# Patient Record
Sex: Male | Born: 1985 | Race: Black or African American | Hispanic: No | Marital: Married | Smoking: Never smoker
Health system: Southern US, Community
[De-identification: ages and names within clinical notes are randomized; demographics above are authoritative.]

---

## 2018-01-23 ENCOUNTER — Ambulatory Visit
Admission: RE | Admit: 2018-01-23 | Discharge: 2018-01-23 | Disposition: A | Discharge status: Home / Self Care | Payer: No Typology Code available for payment source | Source: Ambulatory Visit | Attending: Nurse Practitioner | Admitting: Nurse Practitioner

## 2018-01-23 ENCOUNTER — Other Ambulatory Visit: Payer: Self-pay | Admitting: Nurse Practitioner

## 2018-01-23 DIAGNOSIS — Z021 Encounter for pre-employment examination: Secondary | ICD-10-CM

## 2019-07-30 ENCOUNTER — Other Ambulatory Visit: Payer: Self-pay

## 2019-07-30 ENCOUNTER — Inpatient Hospital Stay (HOSPITAL_COMMUNITY)
Admission: EM | Admit: 2019-07-30 | Discharge: 2019-08-02 | DRG: 177 | Disposition: A | Discharge status: Home / Self Care | Payer: BC Managed Care – PPO | Attending: Family Medicine | Admitting: Family Medicine

## 2019-07-30 ENCOUNTER — Encounter (HOSPITAL_COMMUNITY): Payer: Self-pay | Admitting: Physician Assistant

## 2019-07-30 ENCOUNTER — Emergency Department (HOSPITAL_COMMUNITY): Payer: BC Managed Care – PPO

## 2019-07-30 DIAGNOSIS — E876 Hypokalemia: Secondary | ICD-10-CM | POA: Diagnosis present

## 2019-07-30 DIAGNOSIS — R0902 Hypoxemia: Secondary | ICD-10-CM

## 2019-07-30 DIAGNOSIS — Z23 Encounter for immunization: Secondary | ICD-10-CM

## 2019-07-30 DIAGNOSIS — J9601 Acute respiratory failure with hypoxia: Secondary | ICD-10-CM | POA: Diagnosis present

## 2019-07-30 DIAGNOSIS — J1282 Pneumonia due to coronavirus disease 2019: Secondary | ICD-10-CM | POA: Diagnosis present

## 2019-07-30 DIAGNOSIS — R739 Hyperglycemia, unspecified: Secondary | ICD-10-CM

## 2019-07-30 DIAGNOSIS — U071 COVID-19: Principal | ICD-10-CM | POA: Diagnosis present

## 2019-07-30 DIAGNOSIS — Z6841 Body Mass Index (BMI) 40.0 and over, adult: Secondary | ICD-10-CM | POA: Diagnosis not present

## 2019-07-30 LAB — LACTIC ACID, PLASMA: Lactic Acid, Venous: 0.8 mmol/L (ref 0.5–1.9)

## 2019-07-30 LAB — CBC WITH DIFFERENTIAL/PLATELET
Abs Immature Granulocytes: 0.01 10*3/uL (ref 0.00–0.07)
Basophils Absolute: 0 10*3/uL (ref 0.0–0.1)
Basophils Relative: 0 %
Eosinophils Absolute: 0 10*3/uL (ref 0.0–0.5)
Eosinophils Relative: 0 %
HCT: 48.9 % (ref 39.0–52.0)
Hemoglobin: 16 g/dL (ref 13.0–17.0)
Immature Granulocytes: 0 %
Lymphocytes Relative: 24 %
Lymphs Abs: 1.2 10*3/uL (ref 0.7–4.0)
MCH: 28.7 pg (ref 26.0–34.0)
MCHC: 32.7 g/dL (ref 30.0–36.0)
MCV: 87.8 fL (ref 80.0–100.0)
Monocytes Absolute: 0.5 10*3/uL (ref 0.1–1.0)
Monocytes Relative: 10 %
Neutro Abs: 3.5 10*3/uL (ref 1.7–7.7)
Neutrophils Relative %: 66 %
Platelets: 242 10*3/uL (ref 150–400)
RBC: 5.57 MIL/uL (ref 4.22–5.81)
RDW: 11.4 % — ABNORMAL LOW (ref 11.5–15.5)
WBC: 5.2 10*3/uL (ref 4.0–10.5)
nRBC: 0 % (ref 0.0–0.2)

## 2019-07-30 LAB — COMPREHENSIVE METABOLIC PANEL
ALT: 25 U/L (ref 0–44)
AST: 27 U/L (ref 15–41)
Albumin: 3.4 g/dL — ABNORMAL LOW (ref 3.5–5.0)
Alkaline Phosphatase: 76 U/L (ref 38–126)
Anion gap: 10 (ref 5–15)
BUN: 10 mg/dL (ref 6–20)
CO2: 26 mmol/L (ref 22–32)
Calcium: 8.5 mg/dL — ABNORMAL LOW (ref 8.9–10.3)
Chloride: 103 mmol/L (ref 98–111)
Creatinine, Ser: 0.98 mg/dL (ref 0.61–1.24)
GFR calc Af Amer: >60 mL/min (ref >60)
GFR calc non Af Amer: >60 mL/min (ref >60)
Glucose, Bld: 122 mg/dL — ABNORMAL HIGH (ref 70–99)
Potassium: 3.9 mmol/L (ref 3.5–5.1)
Sodium: 139 mmol/L (ref 135–145)
Total Bilirubin: 0.5 mg/dL (ref 0.3–1.2)
Total Protein: 6.9 g/dL (ref 6.5–8.1)

## 2019-07-30 LAB — TRIGLYCERIDES: Triglycerides: 91 mg/dL (ref <150)

## 2019-07-30 LAB — C-REACTIVE PROTEIN: CRP: 10.7 mg/dL — ABNORMAL HIGH (ref <1.0)

## 2019-07-30 LAB — POC SARS CORONAVIRUS 2 AG -  ED: SARS Coronavirus 2 Ag: POSITIVE — AB

## 2019-07-30 LAB — LACTATE DEHYDROGENASE: LDH: 229 U/L — ABNORMAL HIGH (ref 98–192)

## 2019-07-30 LAB — D-DIMER, QUANTITATIVE: D-Dimer, Quant: 0.46 ug/mL-FEU (ref 0.00–0.50)

## 2019-07-30 LAB — PROCALCITONIN: Procalcitonin: <0.1 ng/mL

## 2019-07-30 LAB — FIBRINOGEN: Fibrinogen: 596 mg/dL — ABNORMAL HIGH (ref 210–475)

## 2019-07-30 LAB — FERRITIN: Ferritin: 235 ng/mL (ref 24–336)

## 2019-07-30 MED ORDER — SODIUM CHLORIDE 0.9 % IV SOLN
100.0000 mg | Freq: Every day | INTRAVENOUS | Status: DC
  Administered 2019-07-31 – 2019-08-02 (×3): 100 mg via INTRAVENOUS
  Filled 2019-07-30 (×4): qty 20

## 2019-07-30 MED ORDER — ACETAMINOPHEN 500 MG PO TABS
1000.0000 mg | ORAL_TABLET | Freq: Once | ORAL | Status: AC
  Administered 2019-07-30: 20:00:00 1000 mg via ORAL
  Filled 2019-07-30: qty 2

## 2019-07-30 MED ORDER — SODIUM CHLORIDE 0.9 % IV SOLN
200.0000 mg | Freq: Once | INTRAVENOUS | Status: AC
  Administered 2019-07-30: 23:00:00 200 mg via INTRAVENOUS
  Filled 2019-07-30: qty 40

## 2019-07-30 NOTE — ED Notes (Signed)
Informed Dr Jacqulyn Bath of Covid pos

## 2019-07-30 NOTE — ED Triage Notes (Signed)
Pt here for [facility] and CP that worsened last night.  Pt tested positive for Covid last week.  Pt dyspneic in triage.

## 2019-07-30 NOTE — ED Provider Notes (Signed)
[facility] EMERGENCY DEPARTMENT Provider Note   CSN: [id number] Arrival date & time: 07/30/19  1801     History No chief complaint on file.   Blake Watson is a 34 y.o. male with no significant past medical history who presents today for evaluation of shortness of breath. He is on day 8 of Covid symptoms.  He reports that last night his shortness of breath and coughing got significantly worse.  He states that he has not had any Tylenol since this morning. States that his wife is an ICU RN upstairs and listen to his lungs and reported that it sounded like he had fluid on them. He reports nausea and one episode of vomiting this morning.  No diarrhea. He denies any past medical history.  He does not take any medications on a daily basis.    HPI     History reviewed. No pertinent past medical history.  There are no problems to display for this patient.   History reviewed. No pertinent surgical history.     History reviewed. No pertinent family history.  Social History   Tobacco Use  . Smoking status: Never Smoker  . Smokeless tobacco: Never Used  Substance Use Topics  . Alcohol use: Not on file  . Drug use: Not on file    Home Medications Prior to Admission medications   Medication Sig Start Date End Date Taking? Authorizing Provider  acetaminophen (TYLENOL) 500 MG tablet Take 500 mg by mouth every 4 (four) hours as needed for mild pain or fever.    Yes [provider]  ibuprofen (ADVIL) 200 MG tablet Take 600 mg by mouth every 4 (four) hours as needed for fever or mild pain.   Yes [provider]    Allergies    Patient has no known allergies.  Review of Systems   Review of Systems  Constitutional: Positive for chills, diaphoresis and fever.  HENT: Positive for congestion.   Eyes: Negative for visual disturbance.  Respiratory: Positive for cough, chest tightness and shortness of breath.   Cardiovascular: Negative for  palpitations and leg swelling.  Gastrointestinal: Positive for nausea and vomiting. Negative for abdominal pain and diarrhea.  Genitourinary: Negative for dysuria.  Musculoskeletal: Positive for arthralgias and myalgias.  Skin: Negative for color change and rash.  Neurological: Positive for headaches. Negative for dizziness and weakness.  Psychiatric/Behavioral: Negative for confusion.  All other systems reviewed and are negative.   Physical Exam Updated Vital Signs BP 125/75 (BP Location: Right Arm)   Pulse 86   Temp 98.7 F (37.1 C) (Oral)   Resp 17   SpO2 97%   Physical Exam Vitals and nursing note reviewed.  Constitutional:      Appearance: He is well-developed. He is obese. He is ill-appearing and diaphoretic.  HENT:     Head: Normocephalic and atraumatic.     Nose: Nose normal.     Mouth/Throat:     Mouth: Mucous membranes are moist.  Eyes:     Conjunctiva/sclera: Conjunctivae normal.  Cardiovascular:     Rate and Rhythm: Regular rhythm. Tachycardia present.     Heart sounds: No murmur.  Pulmonary:     Effort: Pulmonary effort is normal.     Breath sounds: Normal breath sounds.     Comments: Tachypnea, frequently having significant coughing spells without sputum production.  Abdominal:     General: There is no distension.     Palpations: Abdomen is soft.     Tenderness: There  is no abdominal tenderness.  Genitourinary:    Comments: deferred Musculoskeletal:        General: No deformity.     Cervical back: Normal range of motion and neck supple. No rigidity.     Right lower leg: No edema.     Left lower leg: No edema.  Skin:    General: Skin is warm.     Coloration: Skin is not jaundiced.  Neurological:     General: No focal deficit present.     Mental Status: He is alert.  Psychiatric:        Mood and Affect: Mood normal.        Behavior: Behavior normal.     ED Results / Procedures / Treatments   Labs (all labs ordered are listed, but only  abnormal results are displayed) Labs Reviewed  CBC WITH DIFFERENTIAL/PLATELET - Abnormal; Notable for the following components:      Result Value   RDW 11.4 (*)    All other components within normal limits  COMPREHENSIVE METABOLIC PANEL - Abnormal; Notable for the following components:   Glucose, Bld 122 (*)    Calcium 8.5 (*)    Albumin 3.4 (*)    All other components within normal limits  LACTATE DEHYDROGENASE - Abnormal; Notable for the following components:   LDH 229 (*)    All other components within normal limits  FIBRINOGEN - Abnormal; Notable for the following components:   Fibrinogen 596 (*)    All other components within normal limits  C-REACTIVE PROTEIN - Abnormal; Notable for the following components:   CRP 10.7 (*)    All other components within normal limits  POC SARS CORONAVIRUS 2 AG -  ED - Abnormal; Notable for the following components:   SARS Coronavirus 2 Ag POSITIVE (*)    All other components within normal limits  CULTURE, BLOOD (ROUTINE X 2)  CULTURE, BLOOD (ROUTINE X 2)  LACTIC ACID, PLASMA  D-DIMER, QUANTITATIVE (NOT AT [facility])  FERRITIN  TRIGLYCERIDES  LACTIC ACID, PLASMA  PROCALCITONIN    EKG None  Radiology DG Chest Portable 1 View  Result Date: 07/30/2019 CLINICAL DATA:  COVID-19 pneumonia EXAM: PORTABLE CHEST 1 VIEW COMPARISON:  01/23/2018 FINDINGS: The lung volumes are low. There is scattered pulmonary opacities bilaterally. There is no pneumothorax. No large pleural effusion. The heart size is normal. There is no acute osseous abnormality. IMPRESSION: Scattered pulmonary opacities consistent with the patient's history of COVID-19 pneumonia. Electronically Signed   By: Constance Holster M.D.   On: 07/30/2019 19:16    Procedures Procedures (including critical care time)  Medications Ordered in ED Medications  acetaminophen (TYLENOL) tablet 1,000 mg (1,000 mg Oral Given 07/30/19 1959)    ED Course  I have reviewed the triage vital signs and  the nursing notes.  Pertinent labs & imaging results that were available during my care of the patient were reviewed by me and considered in my medical decision making (see chart for details).  Clinical Course as of Jul 29 2122  Mon Jul 30, 2019  1837 Attempted to see patient, he was not in room.    [EH]  2021 D-Dimer, Quant: 0.46 [EH]  2121 Spoke with hospitalist who will see patient for admission.   [EH]    Clinical Course User Index [EH] Lorin Glass, PA-C   During my initial evaluation of patient his oxygen saturation dropped into the high 80s while coughing and speaking.  He is placed on 2 L nasal cannula  oxygen after which his oxygen saturation improved.  MDM Rules/Calculators/A&P                     Patient presents today for evaluation of worsening shortness of breath.  He states he is on day 8 of Covid symptoms however this is not visible in our system. On initial exam he was noted to drop into the high 80s while speaking and coughing with a good waveform and I placed him on 2 L nasal cannula oxygen.  He is febrile at 1-1.3 and tachycardic into the 120s, tachypneic at 30. His tachypnea improved with oxygen. In anticipation of patient being admitted Covid labs were ordered.  CBC and CMP without significant hematologic or electrolyte derangements.  Blood cultures were obtained.  Lactic acid is not elevated at 0.8.  Chest x-ray shows changes consistent with Covid. D-dimer is not significantly elevated.  Patient was treated with Tylenol while in the emergency room.  He was able to maintain his oxygen on 2 liters nasal cannula. With patient's permission I updated his wife, a ICU RN who also recently tested positive about his condition.  I spoke with hospitalist who will see patient for admission.  Blake Watson was evaluated in Emergency Department on 07/30/2019 for the symptoms described in the history of present illness. He was evaluated in the context of the global  COVID-19 pandemic, which necessitated consideration that the patient might be at risk for infection with the SARS-CoV-2 virus that causes COVID-19. Institutional protocols and algorithms that pertain to the evaluation of patients at risk for COVID-19 are in a state of rapid change based on information released by regulatory bodies including the CDC and federal and state organizations. These policies and algorithms were followed during the patient's care in the ED.  Note: Portions of this report may have been transcribed using voice recognition software. Every effort was made to ensure accuracy; however, inadvertent computerized transcription errors may be present  Final Clinical Impression(s) / ED Diagnoses Final diagnoses:  COVID-19  Hypoxia    Rx / DC Orders ED Discharge Orders    None       Norman Clay 07/30/19 2340    Maia Plan, MD 08/01/19 2059

## 2019-07-30 NOTE — H&P (Signed)
[facility] History and Physical  Cartrell Bentsen ZOX:[id number] DOB: 1986-01-01 DOA: 07/30/2019  Referring physician: Phylliss Bob PCP: Patient, No Pcp Per   Chief Complaint: COVID 19 infection  HPI: Akili Corsetti is a 34 y.o. male with no other PMH who presents with worsening shortness of breath in setting of known COVID-19 infection.  Patient reports he was diagnosed with COVID-19 at a [facility] eight days ago. Starting last night he began to have increasing dyspnea. Has also had body chills, fevers, and cough, but denies loss of sense of taste or smell.   Wife is an ICU RN at [facility] [employer] and had been monitoring his O2 levels at home which had been normal. When symptoms worsened he presented to the ED today. While being examined in ED patient had multiple coughing spells during which his O2 sat would drop into the 80's. He was placed on 2L O2 and recommended for admission.   Review of Systems:  12 point ROS conducted with patient and otherwise negative except as noted in HPI  History reviewed. No pertinent past medical history. History reviewed. No pertinent surgical history. Social History:  reports that he has never smoked. He has never used smokeless tobacco. No history on file for alcohol and drug.  No Known Allergies  History reviewed. No pertinent family history.   Prior to Admission medications   Medication Sig Start Date End Date Taking? Authorizing Provider  acetaminophen (TYLENOL) 500 MG tablet Take 500 mg by mouth every 4 (four) hours as needed for mild pain or fever.    Yes [provider]  ibuprofen (ADVIL) 200 MG tablet Take 600 mg by mouth every 4 (four) hours as needed for fever or mild pain.   Yes [provider]   Physical Exam: Vitals:   07/30/19 1915 07/30/19 1940 07/30/19 1956 07/30/19 2121  BP:  (!) 171/91  125/75  Pulse: (!) 102 (!) 104 98 86  Resp:    17  Temp:    98.7 F (37.1 C)  TempSrc:    Oral  SpO2: 100% 91% 98% 97%    Wt  Readings from Last 3 Encounters:  No data found for Wt    General:  Appears calm and comfortable Eyes: PERRL, normal lids, irises & conjunctiva ENT: grossly normal hearing, lips & tongue Neck: no LAD, masses or thyromegaly Cardiovascular: RRR. No LE edema. Telemetry: SR, no arrhythmias  Respiratory: Normal respiratory effort, mild tachypnea. Abdomen: soft, ntnd Skin: no rash or induration seen on limited exam Musculoskeletal: grossly normal tone BUE/BLE Psychiatric: grossly normal mood and affect, speech fluent and appropriate Neurologic: grossly non-focal.          Labs on Admission:  Results for orders placed or performed during the hospital encounter of 07/30/19 (from the past 24 hour(s))  CBC with Differential   Collection Time: 07/30/19  6:39 PM  Result Value Ref Range   WBC 5.2 4.0 - 10.5 K/uL   RBC 5.57 4.22 - 5.81 MIL/uL   Hemoglobin 16.0 13.0 - 17.0 g/dL   HCT 48.9 39.0 - 52.0 %   MCV 87.8 80.0 - 100.0 fL   MCH 28.7 26.0 - 34.0 pg   MCHC 32.7 30.0 - 36.0 g/dL   RDW 11.4 (L) 11.5 - 15.5 %   Platelets 242 150 - 400 K/uL   nRBC 0.0 0.0 - 0.2 %   Neutrophils Relative % 66 %   Neutro Abs 3.5 1.7 - 7.7 K/uL   Lymphocytes Relative 24 %   Lymphs  Abs 1.2 0.7 - 4.0 K/uL   Monocytes Relative 10 %   Monocytes Absolute 0.5 0.1 - 1.0 K/uL   Eosinophils Relative 0 %   Eosinophils Absolute 0.0 0.0 - 0.5 K/uL   Basophils Relative 0 %   Basophils Absolute 0.0 0.0 - 0.1 K/uL   Immature Granulocytes 0 %   Abs Immature Granulocytes 0.01 0.00 - 0.07 K/uL  Comprehensive metabolic panel   Collection Time: 07/30/19  6:39 PM  Result Value Ref Range   Sodium 139 135 - 145 mmol/L   Potassium 3.9 3.5 - 5.1 mmol/L   Chloride 103 98 - 111 mmol/L   CO2 26 22 - 32 mmol/L   Glucose, Bld 122 (H) 70 - 99 mg/dL   BUN 10 6 - 20 mg/dL   Creatinine, Ser 0.98 0.61 - 1.24 mg/dL   Calcium 8.5 (L) 8.9 - 10.3 mg/dL   Total Protein 6.9 6.5 - 8.1 g/dL   Albumin 3.4 (L) 3.5 - 5.0 g/dL   AST 27 15  - 41 U/L   ALT 25 0 - 44 U/L   Alkaline Phosphatase 76 38 - 126 U/L   Total Bilirubin 0.5 0.3 - 1.2 mg/dL   GFR calc non Af Amer >60 >60 mL/min   GFR calc Af Amer >60 >60 mL/min   Anion gap 10 5 - 15  Lactic acid, plasma   Collection Time: 07/30/19  7:55 PM  Result Value Ref Range   Lactic Acid, Venous 0.8 0.5 - 1.9 mmol/L  D-dimer, quantitative   Collection Time: 07/30/19  7:55 PM  Result Value Ref Range   D-Dimer, Quant 0.46 0.00 - 0.50 ug/mL-FEU  Procalcitonin   Collection Time: 07/30/19  7:55 PM  Result Value Ref Range   Procalcitonin <0.10 ng/mL  Lactate dehydrogenase   Collection Time: 07/30/19  7:55 PM  Result Value Ref Range   LDH 229 (H) 98 - 192 U/L  Ferritin   Collection Time: 07/30/19  7:55 PM  Result Value Ref Range   Ferritin 235 24 - 336 ng/mL  Fibrinogen   Collection Time: 07/30/19  7:55 PM  Result Value Ref Range   Fibrinogen 596 (H) 210 - 475 mg/dL  C-reactive protein   Collection Time: 07/30/19  7:55 PM  Result Value Ref Range   CRP 10.7 (H) <1.0 mg/dL  Triglycerides   Collection Time: 07/30/19  7:55 PM  Result Value Ref Range   Triglycerides 91 <150 mg/dL  POC SARS Coronavirus 2 Ag-ED - Nasal Swab (BD Veritor Kit)   Collection Time: 07/30/19  8:37 PM  Result Value Ref Range   SARS Coronavirus 2 Ag POSITIVE (A) NEGATIVE   Radiological Exams on Admission: DG Chest Portable 1 View  Result Date: 07/30/2019 CLINICAL DATA:  COVID-19 pneumonia EXAM: PORTABLE CHEST 1 VIEW COMPARISON:  01/23/2018 FINDINGS: The lung volumes are low. There is scattered pulmonary opacities bilaterally. There is no pneumothorax. No large pleural effusion. The heart size is normal. There is no acute osseous abnormality. IMPRESSION: Scattered pulmonary opacities consistent with the patient's history of COVID-19 pneumonia. Electronically Signed   By: Constance Holster M.D.   On: 07/30/2019 19:16    EKG: Independently reviewed. NSR, TWI in III likely normal variant, overall  unremarkable  Assessment/Plan Active Problems:   COVID-19  Lon Klippel is a 34 y.o. male with no other PMH who presents with hypoxemic respiratory failure secondary to COVID-19 infection.  #COVID-19 Pneumonia #Hypoxemic respiratory failure Known COVID infection now day 8 with mild hypoxemia requiring  2L O2 and mild tachypnea. Mild elevations in CRP (10.7) and LDH (229) and Fibrinogen (596), D-dimer/ferritin/procal/lactic acid are normal.  - continuous pulse ox - remdesivir per protocol - dexamethasone per protocol - consider tocilizumab if respiratory status worsens - daily labs per protocol  Code Status: Full code reviewed with patient DVT Prophylaxis: Lovenox 40 QHS Family Communication: wife is ICU RN at [employer], updated by ED team Disposition Plan: admit to inpatient, transfer to [place]   Time spent: 40 min  [facility]

## 2019-07-30 NOTE — ED Notes (Signed)
Report given to [facility] RN [facility]. Pt to go to room 9153. Pt updated.

## 2019-07-31 ENCOUNTER — Other Ambulatory Visit: Payer: Self-pay

## 2019-07-31 DIAGNOSIS — J9601 Acute respiratory failure with hypoxia: Secondary | ICD-10-CM

## 2019-07-31 DIAGNOSIS — U071 COVID-19: Principal | ICD-10-CM

## 2019-07-31 LAB — COMPREHENSIVE METABOLIC PANEL
ALT: 21 U/L (ref 0–44)
AST: 23 U/L (ref 15–41)
Albumin: 3.2 g/dL — ABNORMAL LOW (ref 3.5–5.0)
Alkaline Phosphatase: 68 U/L (ref 38–126)
Anion gap: 9 (ref 5–15)
BUN: 12 mg/dL (ref 6–20)
CO2: 26 mmol/L (ref 22–32)
Calcium: 8.3 mg/dL — ABNORMAL LOW (ref 8.9–10.3)
Chloride: 103 mmol/L (ref 98–111)
Creatinine, Ser: 0.8 mg/dL (ref 0.61–1.24)
GFR calc Af Amer: >60 mL/min (ref >60)
GFR calc non Af Amer: >60 mL/min (ref >60)
Glucose, Bld: 128 mg/dL — ABNORMAL HIGH (ref 70–99)
Potassium: 3.3 mmol/L — ABNORMAL LOW (ref 3.5–5.1)
Sodium: 138 mmol/L (ref 135–145)
Total Bilirubin: 0.5 mg/dL (ref 0.3–1.2)
Total Protein: 6.8 g/dL (ref 6.5–8.1)

## 2019-07-31 LAB — PHOSPHORUS: Phosphorus: 3.1 mg/dL (ref 2.5–4.6)

## 2019-07-31 LAB — ABO/RH: ABO/RH(D): O POS

## 2019-07-31 LAB — CBC WITH DIFFERENTIAL/PLATELET
Abs Immature Granulocytes: 0.02 10*3/uL (ref 0.00–0.07)
Basophils Absolute: 0 10*3/uL (ref 0.0–0.1)
Basophils Relative: 0 %
Eosinophils Absolute: 0 10*3/uL (ref 0.0–0.5)
Eosinophils Relative: 0 %
HCT: 44.7 % (ref 39.0–52.0)
Hemoglobin: 14.6 g/dL (ref 13.0–17.0)
Immature Granulocytes: 0 %
Lymphocytes Relative: 31 %
Lymphs Abs: 1.4 10*3/uL (ref 0.7–4.0)
MCH: 28.5 pg (ref 26.0–34.0)
MCHC: 32.7 g/dL (ref 30.0–36.0)
MCV: 87.1 fL (ref 80.0–100.0)
Monocytes Absolute: 0.4 10*3/uL (ref 0.1–1.0)
Monocytes Relative: 8 %
Neutro Abs: 2.8 10*3/uL (ref 1.7–7.7)
Neutrophils Relative %: 61 %
Platelets: 231 10*3/uL (ref 150–400)
RBC: 5.13 MIL/uL (ref 4.22–5.81)
RDW: 11.7 % (ref 11.5–15.5)
WBC: 4.6 10*3/uL (ref 4.0–10.5)
nRBC: 0 % (ref 0.0–0.2)

## 2019-07-31 LAB — HIV ANTIBODY (ROUTINE TESTING W REFLEX): HIV Screen 4th Generation wRfx: NONREACTIVE

## 2019-07-31 LAB — C-REACTIVE PROTEIN: CRP: 10.1 mg/dL — ABNORMAL HIGH (ref <1.0)

## 2019-07-31 LAB — MAGNESIUM: Magnesium: 1.8 mg/dL (ref 1.7–2.4)

## 2019-07-31 LAB — D-DIMER, QUANTITATIVE: D-Dimer, Quant: 0.53 ug/mL-FEU — ABNORMAL HIGH (ref 0.00–0.50)

## 2019-07-31 LAB — FERRITIN: Ferritin: 212 ng/mL (ref 24–336)

## 2019-07-31 MED ORDER — ENOXAPARIN SODIUM 80 MG/0.8ML ~~LOC~~ SOLN
0.5000 mg/kg | SUBCUTANEOUS | Status: DC
  Administered 2019-08-01 – 2019-08-02 (×2): 70 mg via SUBCUTANEOUS
  Filled 2019-07-31 (×2): qty 0.8

## 2019-07-31 MED ORDER — ENOXAPARIN SODIUM 30 MG/0.3ML ~~LOC~~ SOLN
30.0000 mg | Freq: Once | SUBCUTANEOUS | Status: AC
  Administered 2019-07-31: 10:00:00 30 mg via SUBCUTANEOUS
  Filled 2019-07-31: qty 0.3

## 2019-07-31 MED ORDER — ONDANSETRON HCL 4 MG PO TABS: 4.0000 mg | ORAL_TABLET | Freq: Four times a day (QID) | ORAL | Status: DC | PRN

## 2019-07-31 MED ORDER — DEXAMETHASONE 6 MG PO TABS
6.0000 mg | ORAL_TABLET | ORAL | Status: DC
  Administered 2019-07-31 – 2019-08-02 (×3): 6 mg via ORAL
  Filled 2019-07-31 (×3): qty 1

## 2019-07-31 MED ORDER — ENOXAPARIN SODIUM 40 MG/0.4ML ~~LOC~~ SOLN
40.0000 mg | SUBCUTANEOUS | Status: DC
  Administered 2019-07-31: 40 mg via SUBCUTANEOUS
  Filled 2019-07-31: qty 0.4

## 2019-07-31 MED ORDER — SODIUM CHLORIDE 0.9 % IV SOLN: 100.0000 mg | Freq: Every day | INTRAVENOUS | Status: DC

## 2019-07-31 MED ORDER — POLYETHYLENE GLYCOL 3350 17 G PO PACK: 17.0000 g | PACK | Freq: Every day | ORAL | Status: DC | PRN

## 2019-07-31 MED ORDER — SODIUM CHLORIDE 0.9 % IV SOLN: 200.0000 mg | Freq: Once | INTRAVENOUS | Status: DC

## 2019-07-31 MED ORDER — ONDANSETRON HCL 4 MG/2ML IJ SOLN: 4.0000 mg | Freq: Four times a day (QID) | INTRAMUSCULAR | Status: DC | PRN

## 2019-07-31 MED ORDER — ACETAMINOPHEN 325 MG PO TABS: 650.0000 mg | ORAL_TABLET | Freq: Four times a day (QID) | ORAL | Status: DC | PRN

## 2019-07-31 MED ORDER — TOCILIZUMAB 400 MG/20ML IV SOLN
800.0000 mg | Freq: Once | INTRAVENOUS | Status: AC
  Administered 2019-07-31: 800 mg via INTRAVENOUS
  Filled 2019-07-31: qty 40

## 2019-07-31 NOTE — Progress Notes (Addendum)
Progress Note  Blake Watson DHR:[id number] DOB: May 19, 1986 DOA: 07/30/2019  Referring physician: Phylliss Bob PCP: Patient, No Pcp Per   Chief Complaint: COVID 19 infection  HPI: Blake Watson is a 34 y.o. male with no other PMH who presents with worsening shortness of breath in setting of known COVID-19 infection. Patient reports he was [facility] with [facility] eight days ago. Starting last night he began to have increasing dyspnea. Has also had body chills, fevers, and cough, but denies loss of sense of taste or smell. Wife is an ICU RN at [facility] and had been monitoring his O2 levels at home which had been normal. When symptoms worsened he presented to the ED today. While being examined in ED patient had multiple coughing spells during which his O2 sat would drop into the 80's. He was placed on 2L O2 and recommended for admission.   Subjective:  No acute issues or events overnight, patient feels his respiratory status is mildly improving, still markedly dyspneic with even minimal exertion.  Denies chest pain, nausea, vomiting, diarrhea, constipation, headache, fevers, chills.  Assessment/Plan Active Problems:   COVID-19   Hypoxic respiratory failure in the setting of COVID-19 pneumonia  Admitted after 8 days of symptoms - concern for prolonged hospital stay/high risk for decompensation given time frame/symptoms Added actemra 2/16 x1 Remdesivir/Dexamethasone 07/30/19-->2/19 and 2/24 respectively Continue supportive care, incentive spirometry, proning, early ambulation SpO2: 94 % O2 Flow Rate (L/min): 2 L/min  Recent Labs    07/30/19 1955 07/31/19 0340  DDIMER 0.46 0.53*  FERRITIN 235 212  LDH 229*  --   CRP 10.7* 10.1*   Code Status: Full code DVT Prophylaxis: Lovenox 40 QHS Family Communication: wife is ICU RN at [employer], updated by patient Disposition Plan: admit to inpatient, disposition pending clinical improvement   Physical Exam: Vitals:   07/31/19 0106  07/31/19 0206 07/31/19 0539 07/31/19 0700  BP: (!) 143/83 136/83 136/78 120/73  Pulse: 82 87 83 84  Resp: 18 (!) 23 16 (!) 25  Temp: 98 F (36.7 C) 99.5 F (37.5 C) 99.6 F (37.6 C) 98.8 F (37.1 C)  TempSrc:  Oral Oral Axillary  SpO2: 96% 99% 96% 94%  Weight:  (!) 140.6 kg  (!) 138.2 kg  Height:  2' 4.35" (0.72 m)  6' (1.829 m)    Wt Readings from Last 3 Encounters:  07/31/19 (!) 138.2 kg   General:  Pleasantly resting in bed, No acute distress. HEENT:  Normocephalic atraumatic.  Sclerae nonicteric, noninjected.  Extraocular movements intact bilaterally. Neck:  Without mass or deformity.  Trachea is midline. Lungs:  Clear to auscultate bilaterally without rhonchi, wheeze, or rales. Heart:  Regular rate and rhythm.  Without murmurs, rubs, or gallops. Abdomen:  Soft, nontender, nondistended.  Without guarding or rebound. Extremities: Without cyanosis, clubbing, edema, or obvious deformity. Vascular:  Dorsalis pedis and posterior tibial pulses palpable bilaterally. Skin:  Warm and dry, no erythema, no ulcerations.     Labs on Admission:  Results for orders placed or performed during the hospital encounter of 07/30/19 (from the past 24 hour(s))  CBC with Differential   Collection Time: 07/30/19  6:39 PM  Result Value Ref Range   WBC 5.2 4.0 - 10.5 K/uL   RBC 5.57 4.22 - 5.81 MIL/uL   Hemoglobin 16.0 13.0 - 17.0 g/dL   HCT 48.9 39.0 - 52.0 %   MCV 87.8 80.0 - 100.0 fL   MCH 28.7 26.0 - 34.0 pg   MCHC 32.7 30.0 -  36.0 g/dL   RDW 11.4 (L) 11.5 - 15.5 %   Platelets 242 150 - 400 K/uL   nRBC 0.0 0.0 - 0.2 %   Neutrophils Relative % 66 %   Neutro Abs 3.5 1.7 - 7.7 K/uL   Lymphocytes Relative 24 %   Lymphs Abs 1.2 0.7 - 4.0 K/uL   Monocytes Relative 10 %   Monocytes Absolute 0.5 0.1 - 1.0 K/uL   Eosinophils Relative 0 %   Eosinophils Absolute 0.0 0.0 - 0.5 K/uL   Basophils Relative 0 %   Basophils Absolute 0.0 0.0 - 0.1 K/uL   Immature Granulocytes 0 %   Abs Immature  Granulocytes 0.01 0.00 - 0.07 K/uL  Comprehensive metabolic panel   Collection Time: 07/30/19  6:39 PM  Result Value Ref Range   Sodium 139 135 - 145 mmol/L   Potassium 3.9 3.5 - 5.1 mmol/L   Chloride 103 98 - 111 mmol/L   CO2 26 22 - 32 mmol/L   Glucose, Bld 122 (H) 70 - 99 mg/dL   BUN 10 6 - 20 mg/dL   Creatinine, Ser 0.98 0.61 - 1.24 mg/dL   Calcium 8.5 (L) 8.9 - 10.3 mg/dL   Total Protein 6.9 6.5 - 8.1 g/dL   Albumin 3.4 (L) 3.5 - 5.0 g/dL   AST 27 15 - 41 U/L   ALT 25 0 - 44 U/L   Alkaline Phosphatase 76 38 - 126 U/L   Total Bilirubin 0.5 0.3 - 1.2 mg/dL   GFR calc non Af Amer >60 >60 mL/min   GFR calc Af Amer >60 >60 mL/min   Anion gap 10 5 - 15  Blood Culture (routine x 2)   Collection Time: 07/30/19  7:51 PM   Specimen: BLOOD  Result Value Ref Range   Specimen Description BLOOD LEFT ANTECUBITAL    Special Requests      BOTTLES DRAWN AEROBIC AND ANAEROBIC Blood Culture results may not be optimal due to an excessive volume of blood received in culture bottles   Culture      NO GROWTH < 12 HOURS Performed at [facility], [address] [place], [place] [address]    Report Status PENDING   Lactic acid, plasma   Collection Time: 07/30/19  7:55 PM  Result Value Ref Range   Lactic Acid, Venous 0.8 0.5 - 1.9 mmol/L  D-dimer, quantitative   Collection Time: 07/30/19  7:55 PM  Result Value Ref Range   D-Dimer, Quant 0.46 0.00 - 0.50 ug/mL-FEU  Procalcitonin   Collection Time: 07/30/19  7:55 PM  Result Value Ref Range   Procalcitonin <0.10 ng/mL  Lactate dehydrogenase   Collection Time: 07/30/19  7:55 PM  Result Value Ref Range   LDH 229 (H) 98 - 192 U/L  Ferritin   Collection Time: 07/30/19  7:55 PM  Result Value Ref Range   Ferritin 235 24 - 336 ng/mL  Fibrinogen   Collection Time: 07/30/19  7:55 PM  Result Value Ref Range   Fibrinogen 596 (H) 210 - 475 mg/dL  C-reactive protein   Collection Time: 07/30/19  7:55 PM  Result Value Ref Range   CRP 10.7  (H) <1.0 mg/dL  Triglycerides   Collection Time: 07/30/19  7:55 PM  Result Value Ref Range   Triglycerides 91 <150 mg/dL  POC SARS Coronavirus 2 Ag-ED - Nasal Swab (BD Veritor Kit)   Collection Time: 07/30/19  8:37 PM  Result Value Ref Range   SARS Coronavirus 2 Ag  POSITIVE (A) NEGATIVE  Blood Culture (routine x 2)   Collection Time: 07/30/19 11:05 PM   Specimen: BLOOD  Result Value Ref Range   Specimen Description BLOOD RIGHT ANTECUBITAL    Special Requests      BOTTLES DRAWN AEROBIC AND ANAEROBIC Blood Culture adequate volume   Culture      NO GROWTH < 12 HOURS Performed at [facility], [place] [address] [place], [place] [address]    Report Status PENDING   CBC with Differential/Platelet   Collection Time: 07/31/19  3:40 AM  Result Value Ref Range   WBC 4.6 4.0 - 10.5 K/uL   RBC 5.13 4.22 - 5.81 MIL/uL   Hemoglobin 14.6 13.0 - 17.0 g/dL   HCT 44.7 39.0 - 52.0 %   MCV 87.1 80.0 - 100.0 fL   MCH 28.5 26.0 - 34.0 pg   MCHC 32.7 30.0 - 36.0 g/dL   RDW 11.7 11.5 - 15.5 %   Platelets 231 150 - 400 K/uL   nRBC 0.0 0.0 - 0.2 %   Neutrophils Relative % 61 %   Neutro Abs 2.8 1.7 - 7.7 K/uL   Lymphocytes Relative 31 %   Lymphs Abs 1.4 0.7 - 4.0 K/uL   Monocytes Relative 8 %   Monocytes Absolute 0.4 0.1 - 1.0 K/uL   Eosinophils Relative 0 %   Eosinophils Absolute 0.0 0.0 - 0.5 K/uL   Basophils Relative 0 %   Basophils Absolute 0.0 0.0 - 0.1 K/uL   Immature Granulocytes 0 %   Abs Immature Granulocytes 0.02 0.00 - 0.07 K/uL  Comprehensive metabolic panel   Collection Time: 07/31/19  3:40 AM  Result Value Ref Range   Sodium 138 135 - 145 mmol/L   Potassium 3.3 (L) 3.5 - 5.1 mmol/L   Chloride 103 98 - 111 mmol/L   CO2 26 22 - 32 mmol/L   Glucose, Bld 128 (H) 70 - 99 mg/dL   BUN 12 6 - 20 mg/dL   Creatinine, Ser 0.80 0.61 - 1.24 mg/dL   Calcium 8.3 (L) 8.9 - 10.3 mg/dL   Total Protein 6.8 6.5 - 8.1 g/dL   Albumin 3.2 (L) 3.5 - 5.0 g/dL   AST 23 15 - 41 U/L   ALT 21 0  - 44 U/L   Alkaline Phosphatase 68 38 - 126 U/L   Total Bilirubin 0.5 0.3 - 1.2 mg/dL   GFR calc non Af Amer >60 >60 mL/min   GFR calc Af Amer >60 >60 mL/min   Anion gap 9 5 - 15  C-reactive protein   Collection Time: 07/31/19  3:40 AM  Result Value Ref Range   CRP 10.1 (H) <1.0 mg/dL  D-dimer, quantitative (not at [facility])   Collection Time: 07/31/19  3:40 AM  Result Value Ref Range   D-Dimer, Quant 0.53 (H) 0.00 - 0.50 ug/mL-FEU  Ferritin   Collection Time: 07/31/19  3:40 AM  Result Value Ref Range   Ferritin 212 24 - 336 ng/mL  Magnesium   Collection Time: 07/31/19  3:40 AM  Result Value Ref Range   Magnesium 1.8 1.7 - 2.4 mg/dL  Phosphorus   Collection Time: 07/31/19  3:40 AM  Result Value Ref Range   Phosphorus 3.1 2.5 - 4.6 mg/dL  ABO/Rh   Collection Time: 07/31/19  3:40 AM  Result Value Ref Range   ABO/RH(D)      O POS Performed at [facility], [place] [address], [place], [place] [address]  Radiological Exams on Admission: DG Chest Portable 1 View  Result Date: 07/30/2019 CLINICAL DATA:  COVID-19 pneumonia EXAM: PORTABLE CHEST 1 VIEW COMPARISON:  01/23/2018 FINDINGS: The lung volumes are low. There is scattered pulmonary opacities bilaterally. There is no pneumothorax. No large pleural effusion. The heart size is normal. There is no acute osseous abnormality. IMPRESSION: Scattered pulmonary opacities consistent with the patient's history of COVID-19 pneumonia. Electronically Signed   By: Constance Holster M.D.   On: 07/30/2019 19:16    EKG: Independently reviewed. NSR, TWI in III likely normal variant, overall unremarkable  Time spent: 40 min  Little Ishikawa DO [facility]

## 2019-07-31 NOTE — Plan of Care (Addendum)
Patient in bed resting. No s/s of pain or distress. Patient currently on room air sats greater than 92%. All medication given well tolerated. Spoke to wife Blake Watson, with care plan update. Will continue to monitor for remainder of shift.  Problem: Education: Goal: Knowledge of General Education information will improve Description: Including pain rating scale, medication(s)/side effects and non-pharmacologic comfort measures 07/31/2019 1215 by Winnifred Friar, RN Outcome: Progressing 07/31/2019 1215 by Winnifred Friar, RN Outcome: Progressing   Problem: Health Behavior/Discharge Planning: Goal: Ability to manage health-related needs will improve 07/31/2019 1215 by Winnifred Friar, RN Outcome: Progressing 07/31/2019 1215 by Winnifred Friar, RN Outcome: Progressing   Problem: Clinical Measurements: Goal: Ability to maintain clinical measurements within normal limits will improve 07/31/2019 1215 by Winnifred Friar, RN Outcome: Progressing 07/31/2019 1215 by Winnifred Friar, RN Outcome: Progressing Goal: Will remain free from infection 07/31/2019 1215 by Winnifred Friar, RN Outcome: Progressing 07/31/2019 1215 by Winnifred Friar, RN Outcome: Progressing Goal: Diagnostic test results will improve 07/31/2019 1215 by Winnifred Friar, RN Outcome: Progressing 07/31/2019 1215 by Winnifred Friar, RN Outcome: Progressing Goal: Respiratory complications will improve 07/31/2019 1215 by Winnifred Friar, RN Outcome: Progressing 07/31/2019 1215 by Winnifred Friar, RN Outcome: Progressing Goal: Cardiovascular complication will be avoided 07/31/2019 1215 by Winnifred Friar, RN Outcome: Progressing 07/31/2019 1215 by Winnifred Friar, RN Outcome: Progressing   Problem: Activity: Goal: Risk for activity intolerance will decrease 07/31/2019 1215 by Winnifred Friar, RN Outcome: Progressing 07/31/2019 1215 by Winnifred Friar, RN Outcome: Progressing   Problem:  Nutrition: Goal: Adequate nutrition will be maintained 07/31/2019 1215 by Winnifred Friar, RN Outcome: Progressing 07/31/2019 1215 by Winnifred Friar, RN Outcome: Progressing   Problem: Coping: Goal: Level of anxiety will decrease 07/31/2019 1215 by Winnifred Friar, RN Outcome: Progressing 07/31/2019 1215 by Winnifred Friar, RN Outcome: Progressing   Problem: Elimination: Goal: Will not experience complications related to bowel motility 07/31/2019 1215 by Winnifred Friar, RN Outcome: Progressing 07/31/2019 1215 by Winnifred Friar, RN Outcome: Progressing Goal: Will not experience complications related to urinary retention 07/31/2019 1215 by Winnifred Friar, RN Outcome: Progressing 07/31/2019 1215 by Winnifred Friar, RN Outcome: Progressing   Problem: Pain Managment: Goal: General experience of comfort will improve 07/31/2019 1215 by Winnifred Friar, RN Outcome: Progressing 07/31/2019 1215 by Winnifred Friar, RN Outcome: Progressing   Problem: Safety: Goal: Ability to remain free from injury will improve 07/31/2019 1215 by Winnifred Friar, RN Outcome: Progressing 07/31/2019 1215 by Winnifred Friar, RN Outcome: Progressing   Problem: Skin Integrity: Goal: Risk for impaired skin integrity will decrease 07/31/2019 1215 by Winnifred Friar, RN Outcome: Progressing 07/31/2019 1215 by Winnifred Friar, RN Outcome: Progressing   Problem: Education: Goal: Knowledge of risk factors and measures for prevention of condition will improve 07/31/2019 1215 by Winnifred Friar, RN Outcome: Progressing 07/31/2019 1215 by Winnifred Friar, RN Outcome: Progressing   Problem: Coping: Goal: Psychosocial and spiritual needs will be supported 07/31/2019 1215 by Winnifred Friar, RN Outcome: Progressing 07/31/2019 1215 by Winnifred Friar, RN Outcome: Progressing   Problem: Respiratory: Goal: Will maintain a patent airway 07/31/2019 1215 by Winnifred Friar, RN Outcome: Progressing 07/31/2019 1215 by Winnifred Friar, RN Outcome: Progressing Goal: Complications related to the disease process, condition or treatment will be avoided or minimized 07/31/2019 1215 by Winnifred Friar, RN Outcome: Progressing 07/31/2019 1215 by Smitty Knudsen  S, RN Outcome: Progressing

## 2019-07-31 NOTE — ED Notes (Signed)
[facility] called by [employer] operational officer. [facility] unable to give ETA

## 2019-08-01 DIAGNOSIS — J9601 Acute respiratory failure with hypoxia: Secondary | ICD-10-CM

## 2019-08-01 LAB — COMPREHENSIVE METABOLIC PANEL
ALT: 21 U/L (ref 0–44)
AST: 21 U/L (ref 15–41)
Albumin: 3.4 g/dL — ABNORMAL LOW (ref 3.5–5.0)
Alkaline Phosphatase: 60 U/L (ref 38–126)
Anion gap: 11 (ref 5–15)
BUN: 12 mg/dL (ref 6–20)
CO2: 26 mmol/L (ref 22–32)
Calcium: 8.5 mg/dL — ABNORMAL LOW (ref 8.9–10.3)
Chloride: 100 mmol/L (ref 98–111)
Creatinine, Ser: 0.7 mg/dL (ref 0.61–1.24)
GFR calc Af Amer: >60 mL/min (ref >60)
GFR calc non Af Amer: >60 mL/min (ref >60)
Glucose, Bld: 121 mg/dL — ABNORMAL HIGH (ref 70–99)
Potassium: 3.9 mmol/L (ref 3.5–5.1)
Sodium: 137 mmol/L (ref 135–145)
Total Bilirubin: 0.4 mg/dL (ref 0.3–1.2)
Total Protein: 6.9 g/dL (ref 6.5–8.1)

## 2019-08-01 LAB — CBC WITH DIFFERENTIAL/PLATELET
Abs Immature Granulocytes: 0.01 10*3/uL (ref 0.00–0.07)
Basophils Absolute: 0 10*3/uL (ref 0.0–0.1)
Basophils Relative: 1 %
Eosinophils Absolute: 0 10*3/uL (ref 0.0–0.5)
Eosinophils Relative: 0 %
HCT: 44 % (ref 39.0–52.0)
Hemoglobin: 14.5 g/dL (ref 13.0–17.0)
Immature Granulocytes: 0 %
Lymphocytes Relative: 34 %
Lymphs Abs: 1.4 10*3/uL (ref 0.7–4.0)
MCH: 28.2 pg (ref 26.0–34.0)
MCHC: 33 g/dL (ref 30.0–36.0)
MCV: 85.6 fL (ref 80.0–100.0)
Monocytes Absolute: 0.6 10*3/uL (ref 0.1–1.0)
Monocytes Relative: 13 %
Neutro Abs: 2.2 10*3/uL (ref 1.7–7.7)
Neutrophils Relative %: 52 %
Platelets: 284 10*3/uL (ref 150–400)
RBC: 5.14 MIL/uL (ref 4.22–5.81)
RDW: 11.3 % — ABNORMAL LOW (ref 11.5–15.5)
WBC: 4.1 10*3/uL (ref 4.0–10.5)
nRBC: 0 % (ref 0.0–0.2)

## 2019-08-01 LAB — C-REACTIVE PROTEIN: CRP: 6.6 mg/dL — ABNORMAL HIGH (ref <1.0)

## 2019-08-01 LAB — D-DIMER, QUANTITATIVE: D-Dimer, Quant: 0.45 ug/mL-FEU (ref 0.00–0.50)

## 2019-08-01 MED ORDER — INFLUENZA VAC SPLIT QUAD 0.5 ML IM SUSY
0.5000 mL | PREFILLED_SYRINGE | INTRAMUSCULAR | Status: AC
  Administered 2019-08-02: 09:00:00 0.5 mL via INTRAMUSCULAR
  Filled 2019-08-01: qty 0.5

## 2019-08-01 MED ORDER — DIPHENHYDRAMINE HCL 25 MG PO CAPS
25.0000 mg | ORAL_CAPSULE | Freq: Four times a day (QID) | ORAL | Status: DC | PRN
  Administered 2019-08-01: 25 mg via ORAL
  Filled 2019-08-01: qty 1

## 2019-08-01 MED ORDER — BENZONATATE 100 MG PO CAPS
100.0000 mg | ORAL_CAPSULE | Freq: Two times a day (BID) | ORAL | Status: DC
  Administered 2019-08-01 – 2019-08-02 (×3): 100 mg via ORAL
  Filled 2019-08-01 (×3): qty 1

## 2019-08-01 MED ORDER — GUAIFENESIN-DM 100-10 MG/5ML PO SYRP
5.0000 mL | ORAL_SOLUTION | ORAL | Status: DC | PRN
  Administered 2019-08-01: 5 mL via ORAL
  Filled 2019-08-01: qty 10

## 2019-08-01 MED ORDER — HYDROCOD POLST-CPM POLST ER 10-8 MG/5ML PO SUER: 5.0000 mL | Freq: Two times a day (BID) | ORAL | Status: DC | PRN

## 2019-08-01 NOTE — Plan of Care (Signed)
Pt maintaining oxygen saturation on room air, pt able to independently ambulate in room and hallways, pt independtly and frequently uses flutter valve and incentive spirometer; educated pt to use call bell if any symptoms of SOB/dyspnea throughout night

## 2019-08-01 NOTE — Progress Notes (Addendum)
SATURATION QUALIFICATIONS: (This note is used to comply with regulatory documentation for home oxygen)  Patient Saturations on Room Air at Rest = 92%  Patient Saturations on Room Air while Ambulating = 84%  Patient Saturations on 4 Liters of oxygen while Ambulating = 91-92%%  Please briefly explain why patient needs home oxygen: Desats with activity.

## 2019-08-01 NOTE — Plan of Care (Signed)
Pt maintaining oxygen saturations on room air, pt able to safely ambulate independently

## 2019-08-01 NOTE — Progress Notes (Signed)
   08/01/19 1217  Family/Significant Other Communication  Family/Significant Other Update Called;Updated (Wife [place])

## 2019-08-01 NOTE — Progress Notes (Signed)
Tele monitor called and reported 4 beats of ectopy on the monitor, checked on pt, pt resting in bed using phone, pt denies cp/palpitation/dizziness; will continue to monitor

## 2019-08-01 NOTE — Progress Notes (Addendum)
1850: Page to MD. Patient asking for medication for sleep. Will inform NOC shift nurse, waiting on order.  1908: Page to covering MD asking for sleeping medication. NOC nurse aware of page and has phone.

## 2019-08-01 NOTE — Progress Notes (Addendum)
PROGRESS NOTE  Blake Watson  KDT:[id number] DOB: 1985-10-28 DOA: 07/30/2019 PCP: Patient, No Pcp Per  Brief Narrative: Blake Watson is a 34 y.o. male with no medical history who was diagnosed with covid-19 on 2/8 who presented to the ED 2/15 with worsening shortness of breath found to have hypoxia worse with coughing spells in the ED. SARS-CoV-2 antigen confirmed positivity, CRP 10.7, procalcitonin undetectable, d-dimer wnl, and CXR showed scattered bilateral opacities. The patient was given remdesivir, steroids, and admitted to [facility] for covid-19 pneumonia.  Assessment & Plan: Principal Problem:   COVID-19 Active Problems:   Acute respiratory failure with hypoxia (HCC)  Acute hypoxemic respiratory failure due to covid-19 pneumonia: SARS-CoV-2 Ag positive on 2/15, also has reportedly positive test on 2/8.  - Remains exertionally hypoxemic requiring 4L with exertion. Wean as tolerated. DC tmrw (if no longer hypoxemic, as we discussed) vs. Friday after 5th dose.  - Continue remdesivir x5 days 2/15 - 2/19 - Steroids x10 days. CRP remains elevated 10 > 6.  - Vitamin C, zinc - Encourage OOB, IS, FV, and awake proning if able - Tylenol and antitussives prn - Continue airborne, contact precautions for 21 days from positive testing. - Check CBC w/diff, CMP, CRP daily  Morbid obesity: Estimated body mass index is 41.31 kg/m as calculated from the following:   Height as of this encounter: 6' (1.829 m).   Weight as of this encounter: 138.2 kg.   Hypokalemia: Resolved.  - Continue monitoring  Hyperglycemia:  - Check HbA1c in AM  DVT prophylaxis: Lovenox 0.5mg /kg q24h Code Status: Full Family Communication: Wife called by RN.  Disposition Plan: Return home once hypoxemia improves. Possible 24-48 hours.  Consultants:   None  Procedures:   None  Antimicrobials:  Remdesivir   Subjective: Shortness of breath is about stable, not getting up too much, but requires oxygen when  he does. He denies chest pain or other new symptoms.   Objective: Vitals:   08/01/19 0246 08/01/19 0307 08/01/19 0619 08/01/19 0740  BP:  134/77  123/78  Pulse: 74 77 82 78  Resp: 20 19 19  (!) 24  Temp:  98.7 F (37.1 C)  97.8 F (36.6 C)  TempSrc:  Oral  Oral  SpO2: 93% 92% 92% 93%  Weight:      Height:        Intake/Output Summary (Last 24 hours) at 08/01/2019 1001 Last data filed at 08/01/2019 0900 Gross per 24 hour  Intake 1790 ml  Output --  Net 1790 ml   Filed Weights   07/31/19 0206 07/31/19 0700  Weight: (!) 140.6 kg (!) 138.2 kg    Gen: 34 y.o. male in no distress Pulm: Non-labored breathing supplemental oxygen, tachypneic. Diminished.  CV: Regular rate and rhythm. No murmur, rub, or gallop. No JVD, no pedal edema. GI: Abdomen soft, non-tender, non-distended, with normoactive bowel sounds. No organomegaly or masses felt. Ext: Warm, no deformities Skin: No rashes, lesions or ulcers Neuro: Alert and oriented. No focal neurological deficits. Psych: Judgement and insight appear normal. Mood & affect appropriate.   Data Reviewed: I have personally reviewed following labs and imaging studies  CBC: Recent Labs  Lab 07/30/19 1839 07/31/19 0340 08/01/19 0254  WBC 5.2 4.6 4.1  NEUTROABS 3.5 2.8 2.2  HGB 16.0 14.6 14.5  HCT 48.9 44.7 44.0  MCV 87.8 87.1 85.6  PLT 242 231 284   Basic Metabolic Panel: Recent Labs  Lab 07/30/19 1839 07/31/19 0340 08/01/19 0254  NA 139 138 137  K 3.9 3.3* 3.9  CL 103 103 100  CO2 26 26 26   GLUCOSE 122* 128* 121*  BUN 10 12 12   CREATININE 0.98 0.80 0.70  CALCIUM 8.5* 8.3* 8.5*  MG  --  1.8  --   PHOS  --  3.1  --    GFR: Estimated Creatinine Clearance: 189.1 mL/min (by C-G formula based on SCr of 0.7 mg/dL). Liver Function Tests: Recent Labs  Lab 07/30/19 1839 07/31/19 0340 08/01/19 0254  AST 27 23 21   ALT 25 21 21   ALKPHOS 76 68 60  BILITOT 0.5 0.5 0.4  PROT 6.9 6.8 6.9  ALBUMIN 3.4* 3.2* 3.4*   No results  for input(s): LIPASE, AMYLASE in the last 168 hours. No results for input(s): AMMONIA in the last 168 hours. Coagulation Profile: No results for input(s): INR, PROTIME in the last 168 hours. Cardiac Enzymes: No results for input(s): CKTOTAL, CKMB, CKMBINDEX, TROPONINI in the last 168 hours. BNP (last 3 results) No results for input(s): PROBNP in the last 8760 hours. HbA1C: No results for input(s): HGBA1C in the last 72 hours. CBG: No results for input(s): GLUCAP in the last 168 hours. Lipid Profile: Recent Labs    07/30/19 1955  TRIG 91   Thyroid Function Tests: No results for input(s): TSH, T4TOTAL, FREET4, T3FREE, THYROIDAB in the last 72 hours. Anemia Panel: Recent Labs    07/30/19 1955 07/31/19 0340  FERRITIN 235 212   Urine analysis: No results found for: COLORURINE, APPEARANCEUR, LABSPEC, PHURINE, GLUCOSEU, HGBUR, BILIRUBINUR, KETONESUR, PROTEINUR, UROBILINOGEN, NITRITE, LEUKOCYTESUR Recent Results (from the past 240 hour(s))  Blood Culture (routine x 2)     Status: None (Preliminary result)   Collection Time: 07/30/19  7:51 PM   Specimen: BLOOD  Result Value Ref Range Status   Specimen Description BLOOD LEFT ANTECUBITAL  Final   Special Requests   Final    BOTTLES DRAWN AEROBIC AND ANAEROBIC Blood Culture results may not be optimal due to an excessive volume of blood received in culture bottles   Culture   Final    NO GROWTH 2 DAYS Performed at [facility], [place] [address], [place], [place] [address]    Report Status PENDING  Incomplete  Blood Culture (routine x 2)     Status: None (Preliminary result)   Collection Time: 07/30/19 11:05 PM   Specimen: BLOOD  Result Value Ref Range Status   Specimen Description BLOOD RIGHT ANTECUBITAL  Final   Special Requests   Final    BOTTLES DRAWN AEROBIC AND ANAEROBIC Blood Culture adequate volume   Culture   Final    NO GROWTH 2 DAYS Performed at [facility], [place] [address], [place], [place] [address]     Report Status PENDING  Incomplete      Radiology Studies: DG Chest Portable 1 View  Result Date: 07/30/2019 CLINICAL DATA:  COVID-19 pneumonia EXAM: PORTABLE CHEST 1 VIEW COMPARISON:  01/23/2018 FINDINGS: The lung volumes are low. There is scattered pulmonary opacities bilaterally. There is no pneumothorax. No large pleural effusion. The heart size is normal. There is no acute osseous abnormality. IMPRESSION: Scattered pulmonary opacities consistent with the patient's history of COVID-19 pneumonia. Electronically Signed   By: Constance Holster M.D.   On: 07/30/2019 19:16    Scheduled Meds: . benzonatate  100 mg Oral BID  . dexamethasone  6 mg Oral Q24H  . enoxaparin (LOVENOX) injection  0.5 mg/kg Subcutaneous Q24H  . [START ON 08/02/2019] influenza vac split quadrivalent  PF  0.5 mL Intramuscular Tomorrow-1000   Continuous Infusions: . remdesivir 100 mg in NS 100 mL 100 mg (08/01/19 0830)     LOS: 2 days   Time spent: 25 minutes.  Tyrone Nine, MD [facility] [email or web address] 08/01/2019, 10:01 AM

## 2019-08-01 NOTE — Progress Notes (Signed)
SATURATION QUALIFICATIONS: (This note is used to comply with regulatory documentation for home oxygen)  Patient Saturations on Room Air at Rest = 94%  Patient Saturations on Room Air while Ambulating = 88%  Patient Saturations on 0 Liters of oxygen while Ambulating = 87-88%%  Please briefly explain why patient needs home oxygen: Desats with prolonged activity.

## 2019-08-02 LAB — CBC WITH DIFFERENTIAL/PLATELET
Abs Immature Granulocytes: 0.03 10*3/uL (ref 0.00–0.07)
Basophils Absolute: 0 10*3/uL (ref 0.0–0.1)
Basophils Relative: 0 %
Eosinophils Absolute: 0 10*3/uL (ref 0.0–0.5)
Eosinophils Relative: 0 %
HCT: 46.1 % (ref 39.0–52.0)
Hemoglobin: 15.2 g/dL (ref 13.0–17.0)
Immature Granulocytes: 1 %
Lymphocytes Relative: 26 %
Lymphs Abs: 1.7 10*3/uL (ref 0.7–4.0)
MCH: 28.6 pg (ref 26.0–34.0)
MCHC: 33 g/dL (ref 30.0–36.0)
MCV: 86.8 fL (ref 80.0–100.0)
Monocytes Absolute: 0.7 10*3/uL (ref 0.1–1.0)
Monocytes Relative: 11 %
Neutro Abs: 3.9 10*3/uL (ref 1.7–7.7)
Neutrophils Relative %: 62 %
Platelets: 344 10*3/uL (ref 150–400)
RBC: 5.31 MIL/uL (ref 4.22–5.81)
RDW: 11.6 % (ref 11.5–15.5)
WBC: 6.3 10*3/uL (ref 4.0–10.5)
nRBC: 0 % (ref 0.0–0.2)

## 2019-08-02 LAB — COMPREHENSIVE METABOLIC PANEL
ALT: 25 U/L (ref 0–44)
AST: 22 U/L (ref 15–41)
Albumin: 3.2 g/dL — ABNORMAL LOW (ref 3.5–5.0)
Alkaline Phosphatase: 63 U/L (ref 38–126)
Anion gap: 11 (ref 5–15)
BUN: 15 mg/dL (ref 6–20)
CO2: 26 mmol/L (ref 22–32)
Calcium: 8.5 mg/dL — ABNORMAL LOW (ref 8.9–10.3)
Chloride: 100 mmol/L (ref 98–111)
Creatinine, Ser: 0.86 mg/dL (ref 0.61–1.24)
GFR calc Af Amer: >60 mL/min (ref >60)
GFR calc non Af Amer: >60 mL/min (ref >60)
Glucose, Bld: 106 mg/dL — ABNORMAL HIGH (ref 70–99)
Potassium: 3.8 mmol/L (ref 3.5–5.1)
Sodium: 137 mmol/L (ref 135–145)
Total Bilirubin: 0.3 mg/dL (ref 0.3–1.2)
Total Protein: 6.8 g/dL (ref 6.5–8.1)

## 2019-08-02 LAB — C-REACTIVE PROTEIN: CRP: 3 mg/dL — ABNORMAL HIGH (ref <1.0)

## 2019-08-02 LAB — HEMOGLOBIN A1C
Hgb A1c MFr Bld: 5.7 % — ABNORMAL HIGH (ref 4.8–5.6)
Mean Plasma Glucose: 116.89 mg/dL

## 2019-08-02 MED ORDER — DEXAMETHASONE 6 MG PO TABS: 6.0000 mg | ORAL_TABLET | ORAL | 0 refills | Status: AC

## 2019-08-02 NOTE — Progress Notes (Signed)
Patient scheduled for outpatient Remdesivir infusion at 11:30 AM on Friday 2/19.   Please advise them to report to [facility] at [address].  Drive to the security guard and tell them you are here for an infusion. They will direct you to the front entrance where we will come and get you.  For questions call [phone].  Thanks   

## 2019-08-02 NOTE — Progress Notes (Signed)
D/c instructions provided to pt. He verbalizes understanding. Pt family member to pick up pt and take home. There are no other questions. Pt understands he has appt at infusion center tomorrow morning. Pt d/c at this time.

## 2019-08-02 NOTE — Discharge Summary (Signed)
Physician Discharge Summary  Blake Watson YNW:[id number] DOB: [birth year] DOA: 07/30/2019  PCP: Patient, No Pcp Per  Admit date: 07/30/2019 Discharge date: 08/02/2019  Admitted From: Home Disposition: Home   Recommendations for Outpatient Follow-up:  1. Follow up with PCP in 1-2 weeks 2. Please obtain CMP/CBC in one week 3. Please follow up on the following pending results: Hemoglobin A1c  Home Health: None Equipment/Devices: None Discharge Condition: Stable CODE STATUS: Full Diet recommendation: Heart healthy/carb-modified  Brief/Interim Summary: Blake Watson is a 34 y.o. male with no medical history who was diagnosed with covid-19 on 2/8 and presented to the ED 2/15 with worsening shortness of breath found to have hypoxia worse with coughing spells in the ED. SARS-CoV-2 antigen confirmed positivity, CRP 10.7, procalcitonin undetectable, d-dimer wnl, and CXR showed scattered bilateral opacities. The patient was given remdesivir, steroids, and admitted to [facility] for covid-19 pneumonia. Hypoxia has since resolved and he is stable for discharge with appointments to complete remdesivir infusions as an outpatient.  Discharge Diagnoses:  Principal Problem:   COVID-19 Active Problems:   Acute respiratory failure with hypoxia (HCC)  Acute hypoxemic respiratory failure due to covid-19 pneumonia: SARS-CoV-2 Ag positive on 2/15, also has reportedly positive test on 2/8. Has been liberated from supplemental oxygen. - Return to work/may end isolation August 13, 2019.  - Complete 5th dose of remdesivir on 2/19 as scheduled prior to discharge.  - Continue steroids x10 days. CRP remains elevated but improving from 10.7 to 3.0.   Morbid obesity: Estimated body mass index is 41.31 kg/m as calculated from the following:   Height as of this encounter: 6' (1.829 m).   Weight as of this encounter: 138.2 kg.   Hypokalemia: Resolved.   Hyperglycemia: Not severe. Has RF's for diabetes, but no  known diagnosis. - HbA1c checked and pending.   Discharge Instructions Discharge Instructions    Diet - low sodium heart healthy   Complete by: As directed    Diet Carb Modified   Complete by: As directed    Discharge instructions   Complete by: As directed    You are being discharged from the hospital after treatment for covid-19 infection. You are felt to be stable enough to no longer require inpatient monitoring, testing, and treatment, though you will need to follow the recommendations below: - Continue taking decadron as directed for 6 more days starting tomorrow AM - Return here for final remdesivir infusion tomorrow at 11:30am. - Per CDC guidelines, you will need to remain in isolation for 21 days from your first positive covid test. You may return to work August 13, 2019. - Do not take NSAID medications (including, but not limited to, ibuprofen, advil, motrin, naproxen, aleve, goody's powder, etc.) - Follow up with your doctor in the next week via telehealth or seek medical attention right away if your symptoms get WORSE.  - Consider donating plasma after you have recovered (either 14 days after a negative test or 28 days after symptoms have completely resolved) because your antibodies to this virus may be helpful to give to others with life-threatening infections. Please go to the website www.[email or web address] if you would like to consider volunteering for plasma donation.    Directions for you at home:  Wear a facemask You should wear a facemask that covers your nose and mouth when you are in the same room with other people and when you visit a healthcare provider. People who live with or visit you should also wear a facemask while they are  in the same room with you.  Separate yourself from other people in your home As much as possible, you should stay in a different room from other people in your home. Also, you should use a separate bathroom, if available.  Avoid sharing household  items You should not share dishes, drinking glasses, cups, eating utensils, towels, bedding, or other items with other people in your home. After using these items, you should wash them thoroughly with soap and water.  Cover your coughs and sneezes Cover your mouth and nose with a tissue when you cough or sneeze, or you can cough or sneeze into your sleeve. Throw used tissues in a lined trash can, and immediately wash your hands with soap and water for at least 20 seconds or use an alcohol-based hand rub.  Wash your [employer] your hands often and thoroughly with soap and water for at least 20 seconds. You can use an alcohol-based hand sanitizer if soap and water are not available and if your hands are not visibly dirty. Avoid touching your eyes, nose, and mouth with unwashed hands.  Directions for those who live with, or provide care at home for you:  Limit the number of people who have contact with the patient If possible, have only one caregiver for the patient. Other household members should stay in another home or place of residence. If this is not possible, they should stay in another room, or be separated from the patient as much as possible. Use a separate bathroom, if available. Restrict visitors who do not have an essential need to be in the home.  Ensure good ventilation Make sure that shared spaces in the home have good air flow, such as from an air conditioner or an opened window, weather permitting.  Wash your hands often Wash your hands often and thoroughly with soap and water for at least 20 seconds. You can use an alcohol based hand sanitizer if soap and water are not available and if your hands are not visibly dirty. Avoid touching your eyes, nose, and mouth with unwashed hands. Use disposable paper towels to dry your hands. If not available, use dedicated cloth towels and replace them when they become wet.  Wear a facemask and gloves Wear a disposable facemask at  all times in the room and gloves when you touch or have contact with the patient's blood, body fluids, and/or secretions or excretions, such as sweat, saliva, sputum, nasal mucus, vomit, urine, or feces.  Ensure the mask fits over your nose and mouth tightly, and do not touch it during use. Throw out disposable facemasks and gloves after using them. Do not reuse. Wash your hands immediately after removing your facemask and gloves. If your personal clothing becomes contaminated, carefully remove clothing and launder. Wash your hands after handling contaminated clothing. Place all used disposable facemasks, gloves, and other waste in a lined container before disposing them with other household waste. Remove gloves and wash your hands immediately after handling these items.  Do not share dishes, glasses, or other household items with the patient Avoid sharing household items. You should not share dishes, drinking glasses, cups, eating utensils, towels, bedding, or other items with a patient who is confirmed to have, or being evaluated for, COVID-19 infection. After the person uses these items, you should wash them thoroughly with soap and water.  Wash laundry thoroughly Immediately remove and wash clothes or bedding that have blood, body fluids, and/or secretions or excretions, such as sweat, saliva, sputum,  nasal mucus, vomit, urine, or feces, on them. Wear gloves when handling laundry from the patient. Read and follow directions on labels of laundry or clothing items and detergent. In general, wash and dry with the warmest temperatures recommended on the label.  Clean all areas the individual has used often Clean all touchable surfaces, such as counters, tabletops, doorknobs, bathroom fixtures, toilets, phones, keyboards, tablets, and bedside tables, every day. Also, clean any surfaces that may have blood, body fluids, and/or secretions or excretions on them. Wear gloves when cleaning surfaces the  patient has come in contact with. Use a diluted bleach solution (e.g., dilute bleach with 1 part bleach and 10 parts water) or a household disinfectant with a label that says EPA-registered for coronaviruses. To make a bleach solution at home, add 1 tablespoon of bleach to 1 quart (4 cups) of water. For a larger supply, add  cup of bleach to 1 gallon (16 cups) of water. Read labels of cleaning products and follow recommendations provided on product labels. Labels contain instructions for safe and effective use of the cleaning product including precautions you should take when applying the product, such as wearing gloves or eye protection and making sure you have good ventilation during use of the product. Remove gloves and wash hands immediately after cleaning.  Monitor yourself for signs and symptoms of illness Caregivers and household members are considered close contacts, should monitor their health, and will be asked to limit movement outside of the home to the extent possible. Follow the monitoring steps for close contacts listed on the symptom monitoring form.  If you have additional questions, contact your local health department or call the epidemiologist on call at [phone] (available 24/7). This guidance is subject to change. For the most up-to-date guidance from [facility], please refer to their website: [employer]Metro.hu   MyChart COVID-19 home monitoring program   Complete by: Aug 02, 2019    Is the patient willing to use the MyChart Mobile App for home monitoring?: Yes     Allergies as of 08/02/2019   No Known Allergies     Medication List    TAKE these medications   acetaminophen 500 MG tablet Commonly known as: TYLENOL Take 500 mg by mouth every 4 (four) hours as needed for mild pain or fever.   Advil 200 MG tablet Generic drug: ibuprofen Take 600 mg by mouth every 4 (four) hours as needed for fever or mild pain.    dexamethasone 6 MG tablet Commonly known as: DECADRON Take 1 tablet (6 mg total) by mouth daily for 6 days. Start taking on: August 03, 2019      Follow-up Information    [facility]. Go on 08/03/2019.   Why: 11:30am. Drive to the security guard and tell them you are here for an infusion. They will direct you to the front entrance where we will come and get you. For questions call [phone]. Contact information: [address] [place] [address] [phone]         No Known Allergies  Consultations:  None  Procedures/Studies: DG Chest Portable 1 View  Result Date: 07/30/2019 CLINICAL DATA:  COVID-19 pneumonia EXAM: PORTABLE CHEST 1 VIEW COMPARISON:  01/23/2018 FINDINGS: The lung volumes are low. There is scattered pulmonary opacities bilaterally. There is no pneumothorax. No large pleural effusion. The heart size is normal. There is no acute osseous abnormality. IMPRESSION: Scattered pulmonary opacities consistent with the patient's history of COVID-19 pneumonia. Electronically Signed  By: Katherine Mantle M.D.   On: 07/30/2019 19:16   Subjective: Feels better, some shortness of breath with walking around the entire unit. Overall improving, eating well, no chest pain or other new complaints.   Discharge Exam: Vitals:   08/02/19 0435 08/02/19 0800  BP:  127/88  Pulse: 70 79  Resp: 18 20  Temp: 97.9 F (36.6 C) 98 F (36.7 C)  SpO2: 92% 92%   General: Pt is alert, awake, not in acute distress Cardiovascular: RRR, S1/S2 +, no rubs, no gallops Respiratory: Nonlabored on room air, minimal bilateral crackles improved. No wheezes. Abdominal: Soft, NT, ND, bowel sounds + Extremities: No edema, no cyanosis  Labs: BNP (last 3 results) No results for input(s): BNP in the last 8760 hours. Basic Metabolic Panel: Recent Labs  Lab 07/30/19 1839 07/31/19 0340 08/01/19 0254 08/02/19 0233  NA 139 138 137 137  K 3.9 3.3*  3.9 3.8  CL 103 103 100 100  CO2 26 26 26 26   GLUCOSE 122* 128* 121* 106*  BUN 10 12 12 15   CREATININE 0.98 0.80 0.70 0.86  CALCIUM 8.5* 8.3* 8.5* 8.5*  MG  --  1.8  --   --   PHOS  --  3.1  --   --    Liver Function Tests: Recent Labs  Lab 07/30/19 1839 07/31/19 0340 08/01/19 0254 08/02/19 0233  AST 27 23 21 22   ALT 25 21 21 25   ALKPHOS 76 68 60 63  BILITOT 0.5 0.5 0.4 0.3  PROT 6.9 6.8 6.9 6.8  ALBUMIN 3.4* 3.2* 3.4* 3.2*   No results for input(s): LIPASE, AMYLASE in the last 168 hours. No results for input(s): AMMONIA in the last 168 hours. CBC: Recent Labs  Lab 07/30/19 1839 07/31/19 0340 08/01/19 0254 08/02/19 0233  WBC 5.2 4.6 4.1 6.3  NEUTROABS 3.5 2.8 2.2 3.9  HGB 16.0 14.6 14.5 15.2  HCT 48.9 44.7 44.0 46.1  MCV 87.8 87.1 85.6 86.8  PLT 242 231 284 344   Cardiac Enzymes: No results for input(s): CKTOTAL, CKMB, CKMBINDEX, TROPONINI in the last 168 hours. BNP: Invalid input(s): POCBNP CBG: No results for input(s): GLUCAP in the last 168 hours. D-Dimer Recent Labs    07/31/19 0340 08/01/19 0254  DDIMER 0.53* 0.45   Hgb A1c Recent Labs    08/02/19 0233  HGBA1C 5.7*   Lipid Profile Recent Labs    07/30/19 1955  TRIG 91   Thyroid function studies No results for input(s): TSH, T4TOTAL, T3FREE, THYROIDAB in the last 72 hours.  Invalid input(s): FREET3 Anemia work up 08/02/19    07/30/19 1955 07/31/19 0340  FERRITIN 235 212   Urinalysis No results found for: COLORURINE, APPEARANCEUR, LABSPEC, PHURINE, GLUCOSEU, HGBUR, BILIRUBINUR, KETONESUR, PROTEINUR, UROBILINOGEN, NITRITE, LEUKOCYTESUR  Microbiology Recent Results (from the past 240 hour(s))  Blood Culture (routine x 2)     Status: None (Preliminary result)   Collection Time: 07/30/19  7:51 PM   Specimen: BLOOD  Result Value Ref Range Status   Specimen Description BLOOD LEFT ANTECUBITAL  Final   Special Requests   Final    BOTTLES DRAWN AEROBIC AND ANAEROBIC Blood Culture  results may not be optimal due to an excessive volume of blood received in culture bottles   Culture   Final    NO GROWTH 3 DAYS Performed at [facility], [address] [place], 08/01/19 [facility]    Report Status PENDING  Incomplete  Blood Culture (routine x 2)  Status: None (Preliminary result)   Collection Time: 07/30/19 11:05 PM   Specimen: BLOOD  Result Value Ref Range Status   Specimen Description BLOOD RIGHT ANTECUBITAL  Final   Special Requests   Final    BOTTLES DRAWN AEROBIC AND ANAEROBIC Blood Culture adequate volume   Culture   Final    NO GROWTH 3 DAYS Performed at [facility], [address] [place], [place] [address]    Report Status PENDING  Incomplete    Time coordinating discharge: Approximately 40 minutes  Tyrone Nine, MD  [facility] 08/02/2019, 10:44 AM

## 2019-08-02 NOTE — Discharge Instructions (Addendum)
You are scheduled for an outpatient infusion of Remdesivir at 11:30 AM on Friday 2/19.  Please report to Lynnell Catalan at [address].  Drive to the security guard and tell them you are here for an infusion. They will direct you to the front entrance where we will come and get you.  For questions call [phone].  Thanks

## 2019-08-02 NOTE — Plan of Care (Signed)

## 2019-08-03 ENCOUNTER — Encounter (HOSPITAL_COMMUNITY): Payer: Self-pay

## 2019-08-03 ENCOUNTER — Ambulatory Visit (HOSPITAL_COMMUNITY)
Admission: RE | Admit: 2019-08-03 | Discharge: 2019-08-03 | Disposition: A | Discharge status: Home / Self Care | Payer: BC Managed Care – PPO | Source: Ambulatory Visit | Attending: Pulmonary Disease | Admitting: Pulmonary Disease

## 2019-08-03 VITALS — BP 128/90 | HR 74 | Temp 98.5°F | Resp 20

## 2019-08-03 DIAGNOSIS — J9601 Acute respiratory failure with hypoxia: Secondary | ICD-10-CM | POA: Diagnosis present

## 2019-08-03 MED ORDER — EPINEPHRINE 0.3 MG/0.3ML IJ SOAJ: 0.3000 mg | Freq: Once | INTRAMUSCULAR | Status: DC | PRN

## 2019-08-03 MED ORDER — SODIUM CHLORIDE 0.9 % IV SOLN
INTRAVENOUS | Status: DC | PRN
  Administered 2019-08-03: 250 mL via INTRAVENOUS

## 2019-08-03 MED ORDER — METHYLPREDNISOLONE SODIUM SUCC 125 MG IJ SOLR: 125.0000 mg | Freq: Once | INTRAMUSCULAR | Status: DC | PRN

## 2019-08-03 MED ORDER — ALBUTEROL SULFATE HFA 108 (90 BASE) MCG/ACT IN AERS: 2.0000 | INHALATION_SPRAY | Freq: Once | RESPIRATORY_TRACT | Status: DC | PRN

## 2019-08-03 MED ORDER — SODIUM CHLORIDE 0.9 % IV SOLN
100.0000 mg | Freq: Once | INTRAVENOUS | Status: AC
  Administered 2019-08-03: 100 mg via INTRAVENOUS
  Filled 2019-08-03: qty 20

## 2019-08-03 MED ORDER — DIPHENHYDRAMINE HCL 50 MG/ML IJ SOLN: 50.0000 mg | Freq: Once | INTRAMUSCULAR | Status: DC | PRN

## 2019-08-03 MED ORDER — FAMOTIDINE IN NACL 20-0.9 MG/50ML-% IV SOLN: 20.0000 mg | Freq: Once | INTRAVENOUS | Status: DC | PRN

## 2019-08-03 NOTE — Progress Notes (Signed)
  Diagnosis: COVID-19  Physician:Dr Wright  Procedure: Covid Infusion Clinic Med: remdesivir infusion.  Complications: No immediate complications noted.  Discharge: Discharged home   Marvette Schamp 08/03/2019   

## 2019-08-04 LAB — CULTURE, BLOOD (ROUTINE X 2)
Culture: NO GROWTH
Culture: NO GROWTH
Special Requests: ADEQUATE

## 2019-08-28 ENCOUNTER — Other Ambulatory Visit: Payer: Self-pay

## 2020-10-08 IMAGING — DX DG CHEST 1V PORT
1 series · 1 of 1 positions shown · non-contrast
Comparison: 01/23/2018

CLINICAL DATA: 9LIMU-IP pneumonia

EXAM:
PORTABLE CHEST 1 VIEW

[chest ap]
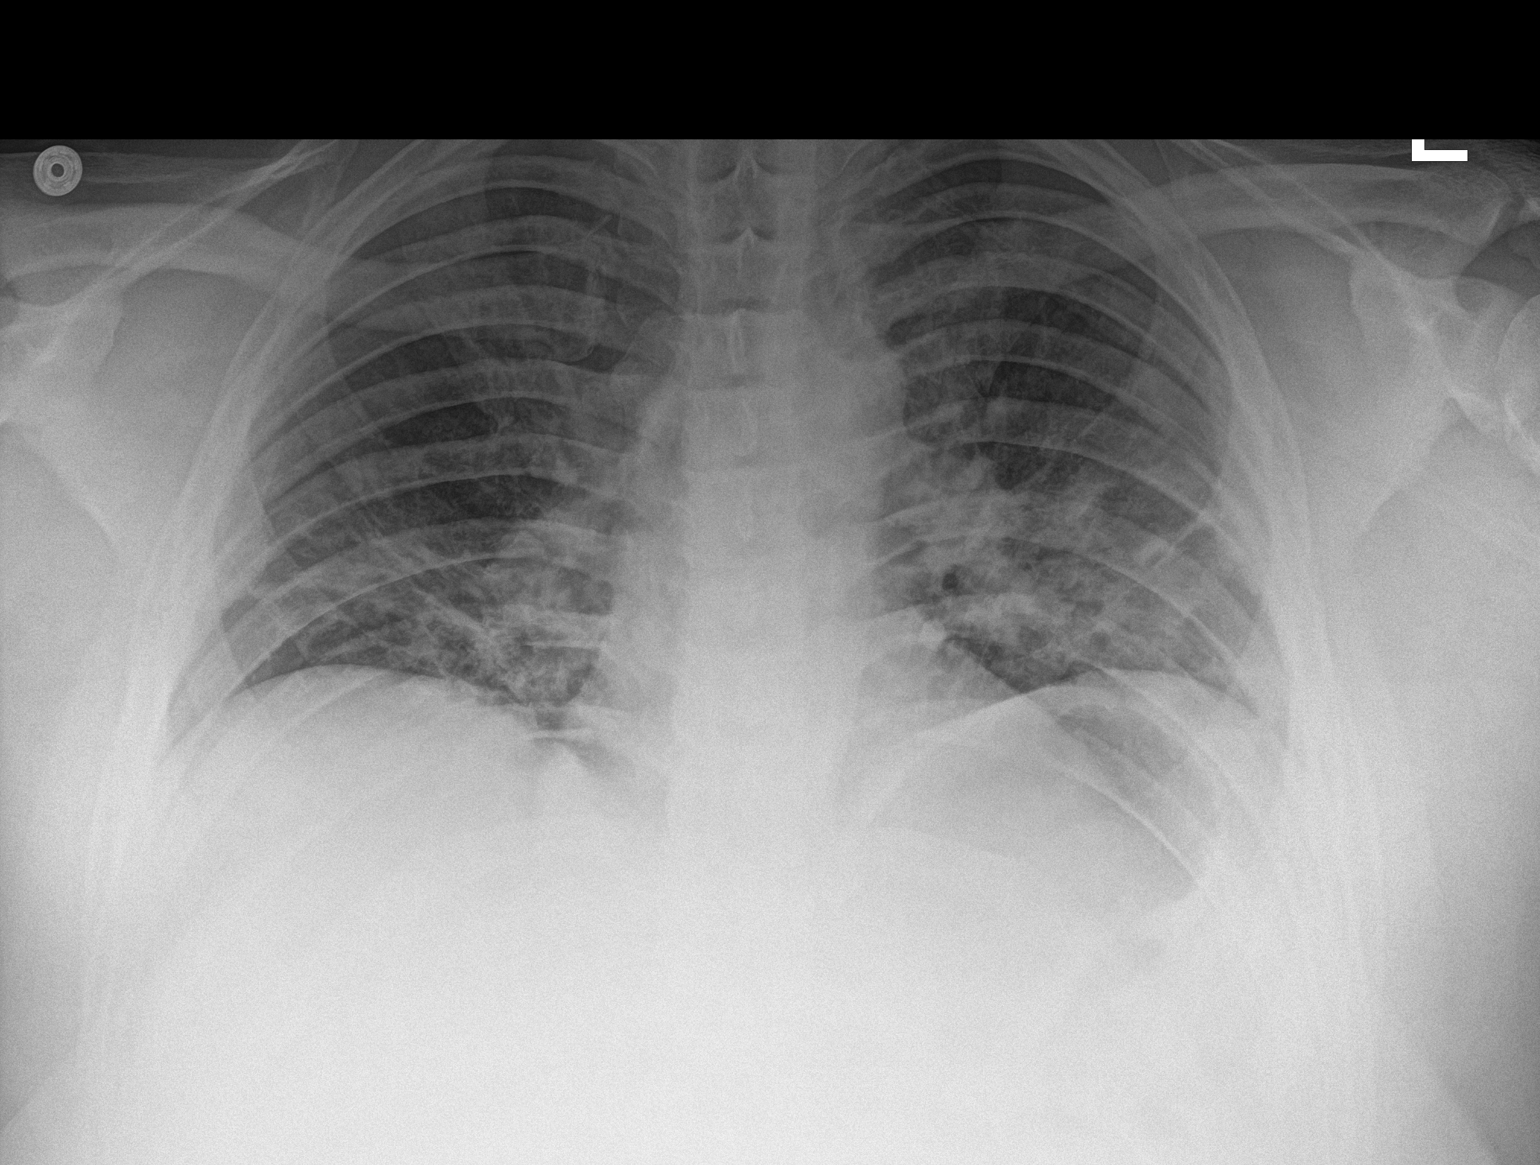

[1 of 1 positions shown; findings below may reference images not displayed]

FINDINGS: The lung volumes are low. There is scattered pulmonary opacities
bilaterally. There is no pneumothorax. No large pleural effusion.
The heart size is normal. There is no acute osseous abnormality.
IMPRESSION: Scattered pulmonary opacities consistent with the patient's history
of 9LIMU-IP pneumonia.

## 2022-12-15 ENCOUNTER — Ambulatory Visit: Payer: BC Managed Care – PPO | Admitting: Nurse Practitioner

## 2024-02-20 ENCOUNTER — Ambulatory Visit: Admitting: Dermatology

## 2024-02-20 ENCOUNTER — Encounter: Payer: Self-pay | Admitting: Dermatology

## 2024-02-20 DIAGNOSIS — L980 Pyogenic granuloma: Secondary | ICD-10-CM | POA: Diagnosis not present

## 2024-02-20 NOTE — Patient Instructions (Signed)

## 2024-02-20 NOTE — Progress Notes (Signed)
   New Patient Visit   Subjective  Blake Watson is a 38 y.o. male who presents for the following: spot at left ring finger, present for about 1 month. Drains pus, bleeds and is very sore. Patient advises when it first came up it looked like a wart and he treated it with OTC wart medication.     The following portions of the chart were reviewed this encounter and updated as appropriate: medications, allergies, medical history  Review of Systems:  No other skin or systemic complaints except as noted in HPI or Assessment and Plan.  Objective  Well appearing patient in no apparent distress; mood and affect are within normal limits.   A focused examination was performed of the following areas: Left hand  Relevant exam findings are noted in the Assessment and Plan.  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 Discussed high risk of recurrence.  No hx Raynauds, lupus, scleroderma, vascular problems.  Patient did not experience full anesthesia with 0.8 ml lidocaine with epinephrine . Patient still felt the burn of the injection, slight pain of the shave removal, burn of cautery on low (4.0 setting), sharpness of curettage, and burn of ferric subsulfate. Given the limits of lido with epi on a digit (or pure lido), additional anesthetic has a risk of vascular compromise of the digit. Fortunately, ferric subsulfate stopped the bleeding. However, the base of the PG is still present and may rebleed or regrow. Discussed that we would refer to a surgeon at that point. Patient will call if this occurs Epidermal / dermal shaving - left 4th finger radial side  Lesion diameter (cm):  0.9 Informed consent: discussed and consent obtained   Timeout: patient name, date of birth, surgical site, and procedure  verified   Procedure prep:  Patient was prepped and draped in usual sterile fashion Prep type:  Isopropyl alcohol Anesthesia: the lesion was anesthetized in a standard fashion   Anesthetic:  1% lidocaine w/ epinephrine  1-100,000 buffered w/ 8.4% NaHCO3 Instrument used: DermaBlade   Hemostasis achieved with: pressure, aluminum chloride, ferric subsulfate and electrodesiccation   Outcome: patient tolerated procedure well   Post-procedure details: wound care instructions given    Specimen 1 - Surgical pathology Differential Diagnosis: r/o Pyogenic Granuloma  Check Margins: No  No follow-ups on file.  Blake Watson, RMA, am acting as scribe for Boneta Sharps, MD .   Documentation: I have reviewed the above documentation for accuracy and completeness, and I agree with the above.  Boneta Sharps, MD

## 2024-02-22 ENCOUNTER — Ambulatory Visit: Payer: Self-pay | Admitting: Dermatology

## 2024-02-22 DIAGNOSIS — L980 Pyogenic granuloma: Secondary | ICD-10-CM

## 2024-02-22 LAB — SURGICAL PATHOLOGY

## 2024-03-05 ENCOUNTER — Telehealth: Payer: Self-pay

## 2024-03-05 NOTE — Telephone Encounter (Signed)
 Progress notes from [facility] in media for review. aw
